# Patient Record
Sex: Male | Born: 2000 | Hispanic: No | Marital: Single | Smoking: Never smoker
Health system: Southern US, Community
[De-identification: ages and names within clinical notes are randomized; demographics above are authoritative.]

---
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
## 2022-08-06 ENCOUNTER — Other Ambulatory Visit (INDEPENDENT_AMBULATORY_CARE_PROVIDER_SITE_OTHER): Payer: Medicaid Other

## 2022-08-06 ENCOUNTER — Encounter: Payer: Self-pay | Admitting: Neurology

## 2022-08-06 ENCOUNTER — Ambulatory Visit (INDEPENDENT_AMBULATORY_CARE_PROVIDER_SITE_OTHER): Payer: Medicaid Other | Admitting: Neurology

## 2022-08-06 VITALS — BP 118/77 | HR 68 | Ht 69.0 in | Wt 174.6 lb

## 2022-08-06 DIAGNOSIS — R252 Cramp and spasm: Secondary | ICD-10-CM

## 2022-08-06 DIAGNOSIS — R55 Syncope and collapse: Secondary | ICD-10-CM | POA: Diagnosis not present

## 2022-08-06 LAB — COMPREHENSIVE METABOLIC PANEL
ALT: 12 U/L (ref 0–53)
AST: 16 U/L (ref 0–37)
Albumin: 5 g/dL (ref 3.5–5.2)
Alkaline Phosphatase: 81 U/L (ref 39–117)
BUN: 13 mg/dL (ref 6–23)
CO2: 28 mEq/L (ref 19–32)
Calcium: 9.8 mg/dL (ref 8.4–10.5)
Chloride: 102 mEq/L (ref 96–112)
Creatinine, Ser: 0.84 mg/dL (ref 0.40–1.50)
GFR: 125.93 mL/min (ref >60.00)
Glucose, Bld: 90 mg/dL (ref 70–99)
Potassium: 3.8 mEq/L (ref 3.5–5.1)
Sodium: 138 mEq/L (ref 135–145)
Total Bilirubin: 0.6 mg/dL (ref 0.2–1.2)
Total Protein: 7.7 g/dL (ref 6.0–8.3)

## 2022-08-06 LAB — MAGNESIUM: Magnesium: 2 mg/dL (ref 1.5–2.5)

## 2022-08-06 NOTE — Progress Notes (Signed)
NEUROLOGY FOLLOW UP OFFICE NOTE  Tarance Penry [id number] 04-12-2001  HISTORY OF PRESENT ILLNESS: I had the pleasure of seeing Lot Wiles in follow-up in the neurology clinic on 08/06/2022. He is alone in the office today. The patient was last seen 9 months ago for syncope. He had an episode of loss of consciousness last 10/13/21 after his finger got stuck in the car door and he felt lightheaded. He also reported prior syncopal episodes when younger. He presents today after another syncopal episode on 08/02/22. He has had a red bump on the right medial canthus for 3 weeks, causing blurred vision. He saw the eye doctor on 3/21 and recalls the doctor put a lot of pressure on his eye then he lost consciousness. Doctor was telling me he was moving but does not think it was a seizure, he recalls that when he woke up both arms were flexed at his chest and he could not move for 5-10 minutes. His fingers were locked and uncomfortable, then after 10 minutes he started getting movement in his hands. BP and HR were okay. He thinks he may have bit his tongue, there is a tender spot on the right side. No incontinence. He was prescribed antibiotic eye drops and swelling is slowly going down.   He denies any staring/unresponsive episodes, gaps in time, olfactory/gustatory hallucinations, focal numbness/tingling/weakness, myoclonic jerks. No headaches, dizziness, no falls. He lives with his parents and works once a week, no staring episodes reported at home or at work.    History on Initial Assessment 11/01/2021: This is a pleasant 22 year old right-handed man presenting for evaluation of an episode of loss of consciousness with seizure-like activity last 10/13/2021. He was with his sister at [employer] and his right middle finger got stuck in the car door. He started having heavy pain, and as they were walking to the sidewalk, he started feeling funny, woozy, very lightheaded. Vision was blurred and he did not  hear what his sister said. She caught him before he hit the ground and she told him his head was moving up and down. He woke up to people around him, his sister gave him water and he took a few sips. He recalls having a little headache and feeling woozy, he was lying on his side when he passed out again for 20-30 seconds per a witness who is an off-duty EMS. The witness told them he had a seizure and that his lips were very pale. No tongue bite or incontinence. When he came to the second time, he was feeling better, having less pain, he was able to talk and understand people around him. No focal weakness or body soreness, no further headache. He reports syncopal episodes 3 times in the past. The first occurred in elementary school, he was also feeling lightheaded and unwell, he alerted his teacher and was sitting at his desk then woke up on the floor and vomited. The second episode occurred around age 22, he is not sure if he scraped his knee or it was too hot outside, then he was back in the house and started feeling lightheaded. He was sitting in the kitchen when he passed out and his brother caught him. The last episode occurred between 2016-2019 when he had his wisdom tooth removed, he stood up and was rinsing his mouth when he started feeling lightheaded and passed out, his brother again caught him.  He lives with his parents. He is a rising sophomore in Radio broadcast assistant.  He denies any staring/unresponsive episodes, any other gaps in time, no olfactory/gustatory hallucinations, deja vu, rising epigastric sensation, focal numbness/tingling/weakness, myoclonic jerks. He has noticed infrequent twitching. He denies any chest pains, palpitations. He usually gets 8 hours of sleep. No sleep deprivation or alcohol prior to the recent episode. He had a normal birth and early development.  There is no history of febrile convulsions, CNS infections such as meningitis/encephalitis, significant traumatic brain injury,  neurosurgical procedures, or family history of seizures.    PAST MEDICAL HISTORY: History reviewed. No pertinent past medical history.  MEDICATIONS: Current Outpatient Medications on File Prior to Visit  Medication Sig Dispense Refill   mupirocin ointment (BACTROBAN) 2 % Apply 1 application. topically daily. 22 g 0   trimethoprim-polymyxin b (POLYTRIM) ophthalmic solution Place 1 drop into the right eye every 6 (six) hours.     No current facility-administered medications on file prior to visit.    ALLERGIES: No Known Allergies  FAMILY HISTORY: Family History  Problem Relation Age of Onset   Arthritis Mother    Hypertension Maternal Grandmother    Hypertension Maternal Grandfather    Hyperlipidemia Maternal Grandfather    Early death Paternal Grandfather    Hypertension Paternal Grandfather     SOCIAL HISTORY: Social History   Socioeconomic History   Marital status: Single    Spouse name: Not on file   Number of children: Not on file   Years of education: Not on file   Highest education level: Not on file  Occupational History   Not on file  Tobacco Use   Smoking status: Never   Smokeless tobacco: Never  Vaping Use   Vaping Use: Never used  Substance and Sexual Activity   Alcohol use: Never   Drug use: Never   Sexual activity: Yes  Other Topics Concern   Not on file  Social History Narrative   Right handed    Lives with parents one level home   Caffeine every other day coffee   Works only on Sundays   Social Determinants of Radio broadcast assistant Strain: Not on file  Food Insecurity: Not on file  Transportation Needs: Not on file  Physical Activity: Not on file  Stress: Not on file  Social Connections: Not on file  Intimate Partner Violence: Not on file     PHYSICAL EXAM: Vitals:   08/06/22 1054  BP: 118/77  Pulse: 68  SpO2: 98%   General: No acute distress Head:  Normocephalic/atraumatic, redness over the right medial  canthus Skin/Extremities: No rash, no edema Neurological Exam: alert and awake. No aphasia or dysarthria. Fund of knowledge is appropriate.  Attention and concentration are normal.   Cranial nerves: Pupils equal, round. Extraocular movements intact with no nystagmus. Visual fields full.  No facial asymmetry.  Motor: Bulk and tone normal, muscle strength 5/5 throughout with no pronator drift.   Finger to nose testing intact.  Gait narrow-based and steady, able to tandem walk adequately.  Romberg negative.   IMPRESSION: This is a pleasant 22 yo RH man with recurrent syncope. There was note of seizure-like activity with episode in 10/2021, and most recently on 08/02/22 while he was again having pain from a procedure, he had another syncopal episode where he woke up with both hands flexed to his chest. We again discussed the pathophysiology of vasovagal syncope, he was advised to increase hydration and alert his providers about his history so procedures can be done supine. EEG will be done for completion.  He reports hand cramps, check CMP, calcium, magnesium. Follow-up in 6 months, call for any changes.    Thank you for allowing me to participate in his care.  Please do not hesitate to call for any questions or concerns.    Ellouise Newer, M.D.   CC: Dr. Owens Shark

## 2022-08-06 NOTE — Patient Instructions (Signed)
Good to see you.  Have bloodwork done for CMP, ionized calcium, magnesium  2. Schedule EEG  3. If you are expecting a procedure to be done, increase hydration before procedure and let provider know about your history, asking if you could have procedure done supine instead of sitting  4. Follow-up in 6 months, call for any changes

## 2022-08-07 LAB — CALCIUM, IONIZED: Calcium, Ion: 5 mg/dL (ref 4.7–5.5)

## 2022-08-08 ENCOUNTER — Telehealth: Payer: Self-pay

## 2022-08-08 NOTE — Telephone Encounter (Signed)
-----   Message from Cameron Sprang, MD sent at 08/07/2022  4:30 PM EDT ----- Pls let him know bloodwork was normal, thanks

## 2022-08-08 NOTE — Telephone Encounter (Signed)
Pt called an informed bloodwork was normal

## 2022-08-13 ENCOUNTER — Ambulatory Visit (INDEPENDENT_AMBULATORY_CARE_PROVIDER_SITE_OTHER): Payer: Medicaid Other | Admitting: Neurology

## 2022-08-13 ENCOUNTER — Encounter: Payer: Self-pay | Admitting: Neurology

## 2022-08-13 DIAGNOSIS — R55 Syncope and collapse: Secondary | ICD-10-CM

## 2022-08-13 DIAGNOSIS — R252 Cramp and spasm: Secondary | ICD-10-CM

## 2022-08-13 NOTE — Progress Notes (Signed)
EEG complete - results pending 

## 2022-09-03 NOTE — Procedures (Signed)
ELECTROENCEPHALOGRAM REPORT  Date of Study: 08/13/2022  Patient's Name: Wayne Wilkins MRN: [id number] Date of Birth: [birth year]  Referring Provider: Dr. Patrcia Dolly  Clinical History: This is a 22 year old man with recurrent syncope. EEG for classification.  Medications: none  Technical Summary: A multichannel digital EEG recording measured by the international 10-20 system with electrodes applied with paste and impedances below 5000 ohms performed in our laboratory with EKG monitoring in an awake and asleep patient.  Hyperventilation and photic stimulation were performed.  The digital EEG was referentially recorded, reformatted, and digitally filtered in a variety of bipolar and referential montages for optimal display.    Description: The patient is awake and asleep during the recording.  During maximal wakefulness, there is a symmetric, medium voltage 10.5 Hz posterior dominant rhythm that attenuates with eye opening.  The record is symmetric.  During drowsiness and sleep, there is an increase in theta slowing of the background.  Vertex waves and symmetric sleep spindles were seen. Hyperventilation and photic stimulation did not elicit any abnormalities.  There were no epileptiform discharges or electrographic seizures seen.    EKG lead showed sinus bradycardia at 54 bpm.  Impression: This awake and asleep EEG is normal.    Clinical Correlation: A normal EEG does not exclude a clinical diagnosis of epilepsy.  If further clinical questions remain, prolonged EEG may be helpful. 1-lead EKG showed sinus bradycardia at 54 bpm. Clinical correlation is advised.   Patrcia Dolly, M.D.

## 2022-09-11 ENCOUNTER — Telehealth: Payer: Self-pay

## 2022-09-11 NOTE — Telephone Encounter (Signed)
-----   Message from Van Clines, MD sent at 09/07/2022  1:52 PM EDT ----- Pls let patient know the EEG was normal, thanks

## 2022-09-11 NOTE — Telephone Encounter (Signed)
Pt called informed that EEG was normal

## 2023-02-22 ENCOUNTER — Ambulatory Visit: Payer: Medicaid Other | Admitting: Neurology

## 2023-02-22 ENCOUNTER — Encounter: Payer: Self-pay | Admitting: Neurology

## 2023-08-14 ENCOUNTER — Ambulatory Visit (HOSPITAL_COMMUNITY): Admission: EM | Admit: 2023-08-14 | Discharge: 2023-08-14 | Disposition: A | Discharge status: Home / Self Care

## 2023-08-14 ENCOUNTER — Encounter (HOSPITAL_COMMUNITY): Payer: Self-pay | Admitting: *Deleted

## 2023-08-14 ENCOUNTER — Other Ambulatory Visit: Payer: Self-pay

## 2023-08-14 DIAGNOSIS — M67431 Ganglion, right wrist: Secondary | ICD-10-CM

## 2023-08-14 NOTE — ED Provider Notes (Signed)
 [facility]    CSN: [id number] Arrival date & time: 08/14/23  1005      History   Chief Complaint Chief Complaint  Patient presents with   knot on wrist    HPI Wayne Wilkins is a 23 y.o. male.   Patient presents with a knot on the medial anterior aspect of his right wrist x 2 weeks.  Patient states that the area did appear to be larger about a week ago and he has applied warm compresses with decrease in swelling.  Patient denies any known injury.  Denies pain and redness.     History reviewed. No pertinent past medical history.  Patient Active Problem List   Diagnosis Date Noted   Obesity 06/29/2014    History reviewed. No pertinent surgical history.     Home Medications    Prior to Admission medications   Medication Sig Start Date End Date Taking? Authorizing Provider  mupirocin ointment (BACTROBAN) 2 % Apply 1 application. topically daily. 10/13/21   Raspet, Noberto Retort, PA-C  trimethoprim-polymyxin b (POLYTRIM) ophthalmic solution Place 1 drop into the right eye every 6 (six) hours. 08/02/22   [provider]    Family History Family History  Problem Relation Age of Onset   Arthritis Mother    Hypertension Maternal Grandmother    Hypertension Maternal Grandfather    Hyperlipidemia Maternal Grandfather    Early death Paternal Grandfather    Hypertension Paternal Grandfather     Social History Social History   Tobacco Use   Smoking status: Never   Smokeless tobacco: Never  Vaping Use   Vaping status: Never Used  Substance Use Topics   Alcohol use: Never   Drug use: Never     Allergies   Patient has no known allergies.   Review of Systems Review of Systems  Per HPI  Physical Exam Triage Vital Signs ED Triage Vitals  Encounter Vitals Group     BP 08/14/23 1054 115/70     Systolic BP Percentile --      Diastolic BP Percentile --      Pulse Rate 08/14/23 1054 (!) 56     Resp 08/14/23 1054 18     Temp 08/14/23  1054 98.5 F (36.9 C)     Temp src --      SpO2 08/14/23 1054 98 %     Weight --      Height --      Head Circumference --      Peak Flow --      Pain Score 08/14/23 1052 2     Pain Loc --      Pain Education --      Exclude from Growth Chart --    No data found.  Updated Vital Signs BP 115/70   Pulse (!) 56   Temp 98.5 F (36.9 C)   Resp 18   SpO2 98%   Visual Acuity Right Eye Distance:   Left Eye Distance:   Bilateral Distance:    Right Eye Near:   Left Eye Near:    Bilateral Near:     Physical Exam Vitals and nursing note reviewed.  Constitutional:      General: He is awake. He is not in acute distress.    Appearance: Normal appearance. He is well-developed and well-groomed. He is not ill-appearing.  Skin:    General: Skin is warm and dry.     Comments: 1 cm x 1 cm mobile cyst noted to  the volar aspect of the wrist.  Without pain, redness, and swelling.  Neurological:     Mental Status: He is alert.  Psychiatric:        Behavior: Behavior is cooperative.      UC Treatments / Results  Labs (all labs ordered are listed, but only abnormal results are displayed) Labs Reviewed - No data to display  EKG   Radiology No results found.  Procedures Procedures (including critical care time)  Medications Ordered in UC Medications - No data to display  Initial Impression / Assessment and Plan / UC Course  I have reviewed the triage vital signs and the nursing notes.  Pertinent labs & imaging results that were available during my care of the patient were reviewed by me and considered in my medical decision making (see chart for details).     Upon assessment there is a 1 cm x 1 cm mobile cyst noted to the volar aspect of the wrist without pain, redness, and swelling.  This is consistent with a ganglion cyst.  Recommended patient continue with warm compresses.  Given dermatology follow-up.  Discussed return precautions Final Clinical Impressions(s) / UC  Diagnoses   Final diagnoses:  Ganglion cyst of wrist, right     Discharge Instructions      As discussed the area on your wrist appears to be a ganglion cyst.  I have attached a dermatologist that you can follow-up with if this area persists or becomes worse or more bothersome.  If you notice significant increase in swelling, redness, pain, or drainage from the area return here for reevaluation.    ED Prescriptions   None    PDMP not reviewed this encounter.   Wynonia Lawman A, NP 08/14/23 1115

## 2023-08-14 NOTE — Discharge Instructions (Signed)
 As discussed the area on your wrist appears to be a ganglion cyst.  I have attached a dermatologist that you can follow-up with if this area persists or becomes worse or more bothersome.  If you notice significant increase in swelling, redness, pain, or drainage from the area return here for reevaluation.

## 2023-08-14 NOTE — ED Triage Notes (Signed)
 Pt reports he has a knot on his Rt anterior wrist for 2 weeks. No injury noted.

## 2024-02-26 IMAGING — DX DG FINGER MIDDLE 2+V*R*
3 series · 3 of 3 positions shown · non-contrast
Comparison: None Available.

CLINICAL DATA: Distal middle finger pain

EXAM:
RIGHT MIDDLE FINGER 3V

[finger ap]
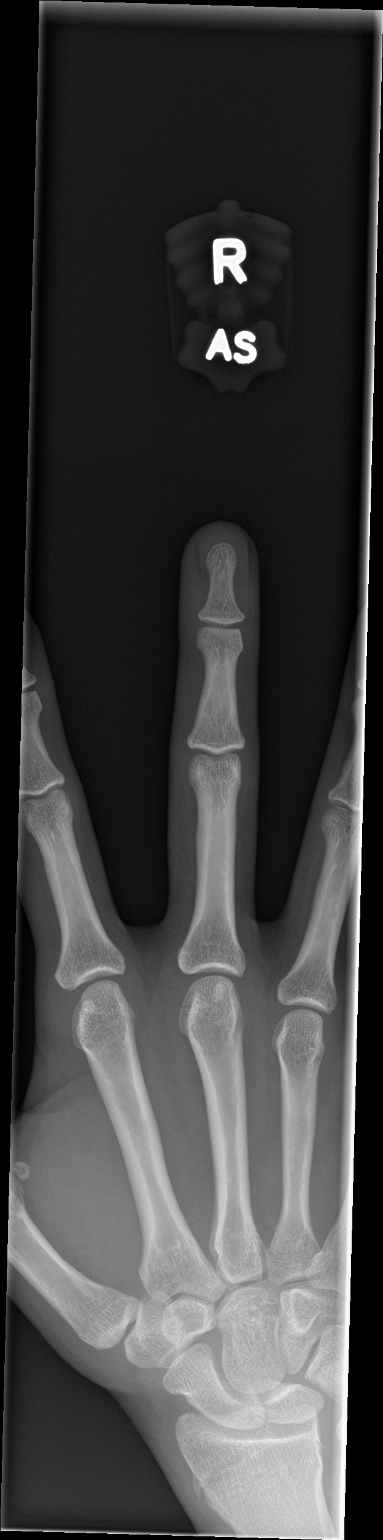

[finger obl]
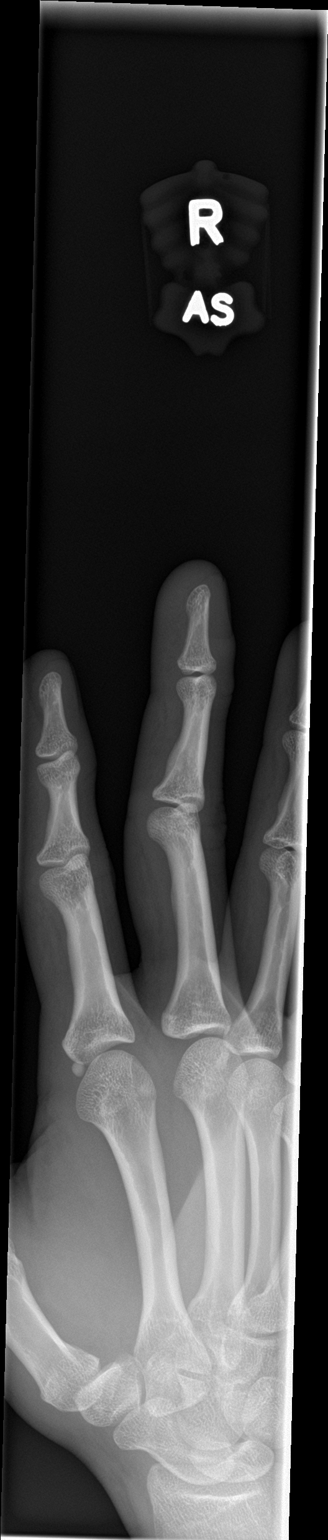

[finger lat]
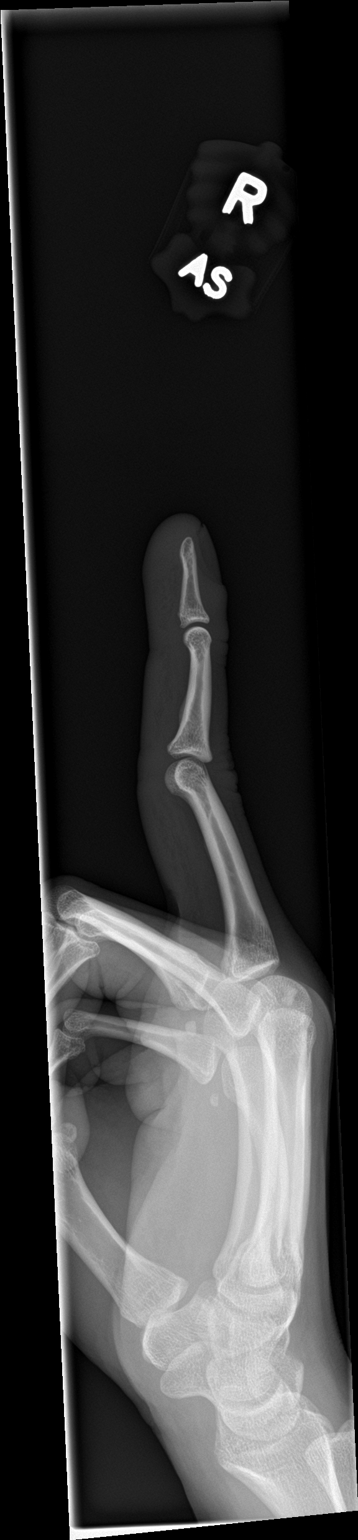

[3 of 3 positions shown; findings below may reference images not displayed]

FINDINGS: There is no evidence of fracture or dislocation. There is no
evidence of arthropathy or other focal bone abnormality. Soft
tissues are unremarkable.
IMPRESSION: Negative.
